# Patient Record
Sex: Male | Born: 2000 | Hispanic: No | Marital: Single | Smoking: Never smoker
Health system: Southern US, Community
[De-identification: ages and names within clinical notes are randomized; demographics above are authoritative.]

## PROBLEM LIST (undated history)

## (undated) DIAGNOSIS — Z789 Other specified health status: Secondary | ICD-10-CM

## (undated) HISTORY — DX: Other specified health status: Z78.9

---
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
## 2020-03-12 ENCOUNTER — Encounter: Payer: Self-pay | Admitting: Pediatrics

## 2021-01-26 ENCOUNTER — Telehealth (INDEPENDENT_AMBULATORY_CARE_PROVIDER_SITE_OTHER): Payer: Medicaid Other | Admitting: Pediatrics

## 2021-01-26 ENCOUNTER — Encounter: Payer: Self-pay | Admitting: Pediatrics

## 2021-01-26 DIAGNOSIS — U071 COVID-19: Secondary | ICD-10-CM

## 2021-01-26 DIAGNOSIS — Z7189 Other specified counseling: Secondary | ICD-10-CM | POA: Diagnosis not present

## 2021-01-26 NOTE — Progress Notes (Signed)
[facility] Video Visit Note   I connected with Edwin Turner by a video enabled telemedicine application and verified that I am speaking with the correct person using two identifiers on 01/26/21 @ 4:05 pm  No interpreter is needed.    Location of patient/parent: at home Location of provider:  Office [facility]   I discussed the limitations of evaluation and management by telemedicine and the availability of in person appointments.   I discussed that the purpose of this telemedicine visit is to provide medical care while limiting exposure to the novel coronavirus.   "I advised the patient  that by engaging in this telehealth visit, they consent to the provision of healthcare.   Additionally, they authorize for the patient's insurance to be billed for the services provided during this telehealth visit.   They expressed understanding and agreed to proceed."  Edwin Turner   [birth year] Chief Complaint  Patient presents with   Fever   Headache   Generalized Body Aches   covid postitve    Test done at home, Tylenlol last taken about 1 hour ago he took 2 500 mg     Reason for visit:  Covid-19 positive home test   HPI Chief complaint or reason for telemedicine visit: Relevant History, background, and/or results  Edwin Turner reports that his father was insistant that he have a video visit today. Onset of fever 01/25/21 with chills/sweating, myalgia, cough, frontal headache and sore throat.  Tmax today  100.  He has not lost his sense of smell or taste. He took tylenol 1000 mg 01/25/21 which helped him to rest overnight.  His mother gave him a product from [place] [employer] which he also reports helped him to feel better.  He is not able to report the ingredients.  From internet search:  Cough and cold medication with  CHLORPHENIRAMINE MALEATE (UNII: [id number]) (CHLORPHENIRAMINE - UNII:[id number]) CHLORPHENIRAMINE MALEATE 4 mg PHENYLEPHRINE  HYDROCHLORIDE (UNII: [id number]) (PHENYLEPHRINE - UNII:[id number]) PHENYLEPHRINE HYDROCHLORIDE 10 mg  He is eating and drinking normally No sick contacts at home, lives with mother He is working full time and did not report to work on 01/26/21.   Observations/Objective during telemedicine visit:  Edwin Turner is lying in bed He is not ill appearing or toxic appearing Talking with ease No coughing during video visit He is drinking water and gatorade.  No SOB or increased work of breathing noted.   Concern #2 - adolescent transition of care to adult provider -20 year old patient -last [facility] 06/12/2019 -PCP has retired from [employer] -Discussed transition to adult practice as patient had some discussion with Dr. Lubertha South at last [facility] -Discussed adult [facility] practice options.  Edwin Turner would like a list of [facility] practices to choose from so that he can discuss with his mother as he is on her insurance?   ROS: Negative except as noted above   Patient Active Problem List   Diagnosis Date Noted   Fracture of left distal radius 03/02/2019   Acne vulgaris 01/09/2017   Pulled muscle 01/09/2017     History reviewed. No pertinent surgical history.  No Known Allergies  Immunization status: up to date and documented.   Outpatient Encounter Medications as of 01/26/2021  Medication Sig   [DISCONTINUED] clindamycin-benzoyl peroxide (BENZACLIN) gel Apply topically daily. Apply small amount to clean dry skin. (Patient not taking: Reported on 06/12/2019)   No facility-administered encounter medications on file as of 01/26/2021.    No results  found for this or any previous visit (from the past 72 hour(s)).  Assessment/Plan/Next steps:  1. COVID-19 virus infection Edwin Turner is a 20 year old male who did a home covid-19 test this morning and it was positive.  He had onset of symptoms consistent with covid -19 on 01/25/21, fever, chills, myalgia, headache, sore throat, cough with no loss of smell  or taste.  Discussion of usual course of illness.  Review of quarantine procedures, reasons to seek care in emergency room if SOB, fever > 4-5 days, vomiting, signs of dehydration.  Parent verbalizes understanding and motivation to comply with  all instructions.  Supportive care measures reviewed for home care.  Will provide note for week as he will need to be out for the remainder of the week. Provided guidance about return to work and absence of symptoms for >/=24 hours.  Completed reporting form to [facility] and faxed.    2. Other specified counseling 20 year old patient, who has not been seen for [facility] in last 18 months and PCP has retired from [employer].  Discussed transition to adult care, practice options.  Patient feels ready to transition but does not want to make any decision about which practice until he is able to discuss with his mother.  Will provide letter with practice choices.  Will have Office Case Manager, [facility] [place] in about 1 month to find out which practice he plans to transition to.  The time based billing for medical video visits has changed to include all time spent on the patient's care on the date of service (preparing for the visit, face-to-face with the patient/parent, care coordination, and documentation).  You can use the following phrase or something similar  Time spent reviewing chart in preparation for visit:  5 minutes Time spent face-to-face with patient: 20 minutes Time spent not face-to-face with patient for documentation, reporting form, letter for employer to excuse absences.   and care coordination on date of service: 10 minutes  I discussed the assessment and treatment plan with the patient and/or parent/guardian. They were provided an opportunity to ask questions and all were answered.  They agreed with the plan and demonstrated an understanding of the instructions.   Follow Up Instructions They were advised to call back or seek an  in-person evaluation in the emergency room if the symptoms worsen or if the condition fails to improve as anticipated.   Marjie Skiff, NP 01/26/2021 5:56 PM

## 2021-01-27 ENCOUNTER — Telehealth: Payer: Self-pay

## 2021-01-27 DIAGNOSIS — Z09 Encounter for follow-up examination after completed treatment for conditions other than malignant neoplasm: Secondary | ICD-10-CM

## 2021-01-27 NOTE — Telephone Encounter (Signed)
[facility] mailed pt Adolescent Transition letter with [facility] PCP practices included. Also dismissed from practice after 02/25/2021.    Kenn File, BSW, QP Case Manager [facility] [employer] [facility] Office: [phone] Direct Number: [phone]
